# Patient Record
Sex: Male | Born: 1985 | Race: Black or African American | Hispanic: No | Marital: Single | Smoking: Current every day smoker
Health system: Southern US, Community
[De-identification: ages and names within clinical notes are randomized; demographics above are authoritative.]

---

## 2010-12-25 ENCOUNTER — Emergency Department: Payer: Self-pay | Admitting: Emergency Medicine

## 2011-08-14 ENCOUNTER — Emergency Department: Payer: Self-pay | Admitting: Emergency Medicine

## 2011-08-22 ENCOUNTER — Emergency Department: Payer: Self-pay | Admitting: *Deleted

## 2012-06-14 ENCOUNTER — Emergency Department: Payer: Self-pay | Admitting: Emergency Medicine

## 2012-06-15 LAB — BASIC METABOLIC PANEL
Anion Gap: 5 — ABNORMAL LOW (ref 7–16)
BUN: 15 mg/dL (ref 7–18)
Calcium, Total: 8.7 mg/dL (ref 8.5–10.1)
Chloride: 107 mmol/L (ref 98–107)
Creatinine: 1.23 mg/dL (ref 0.60–1.30)
EGFR (African American): >60
EGFR (Non-African Amer.): >60
Potassium: 3.4 mmol/L — ABNORMAL LOW (ref 3.5–5.1)
Sodium: 139 mmol/L (ref 136–145)

## 2012-06-15 LAB — CBC
HGB: 13.9 g/dL (ref 13.0–18.0)
Platelet: 129 10*3/uL — ABNORMAL LOW (ref 150–440)
RDW: 12.7 % (ref 11.5–14.5)
WBC: 8.4 10*3/uL (ref 3.8–10.6)

## 2012-06-15 LAB — DRUG SCREEN, URINE
Amphetamines, Ur Screen: NEGATIVE (ref ?–1000)
Barbiturates, Ur Screen: NEGATIVE (ref ?–200)
Benzodiazepine, Ur Scrn: NEGATIVE (ref ?–200)
Cannabinoid 50 Ng, Ur ~~LOC~~: POSITIVE (ref ?–50)
Methadone, Ur Screen: NEGATIVE (ref ?–300)
Opiate, Ur Screen: NEGATIVE (ref ?–300)
Tricyclic, Ur Screen: NEGATIVE (ref ?–1000)

## 2012-06-15 LAB — CK TOTAL AND CKMB (NOT AT ARMC): CK, Total: 725 U/L — ABNORMAL HIGH (ref 35–232)

## 2012-08-11 ENCOUNTER — Emergency Department: Payer: Self-pay | Admitting: Emergency Medicine

## 2012-08-12 LAB — BETA STREP CULTURE(ARMC)

## 2012-09-12 ENCOUNTER — Emergency Department: Payer: Self-pay | Admitting: Emergency Medicine

## 2014-10-01 IMAGING — CR DG CHEST 2V
1 series · 2 of 2 positions shown · non-contrast
Comparison: none

REASON FOR EXAM: CP
COMMENTS:

[Series 1: w chest pa · 0.14mm/px · 2 of 2 slices shown]
[im 1/2]
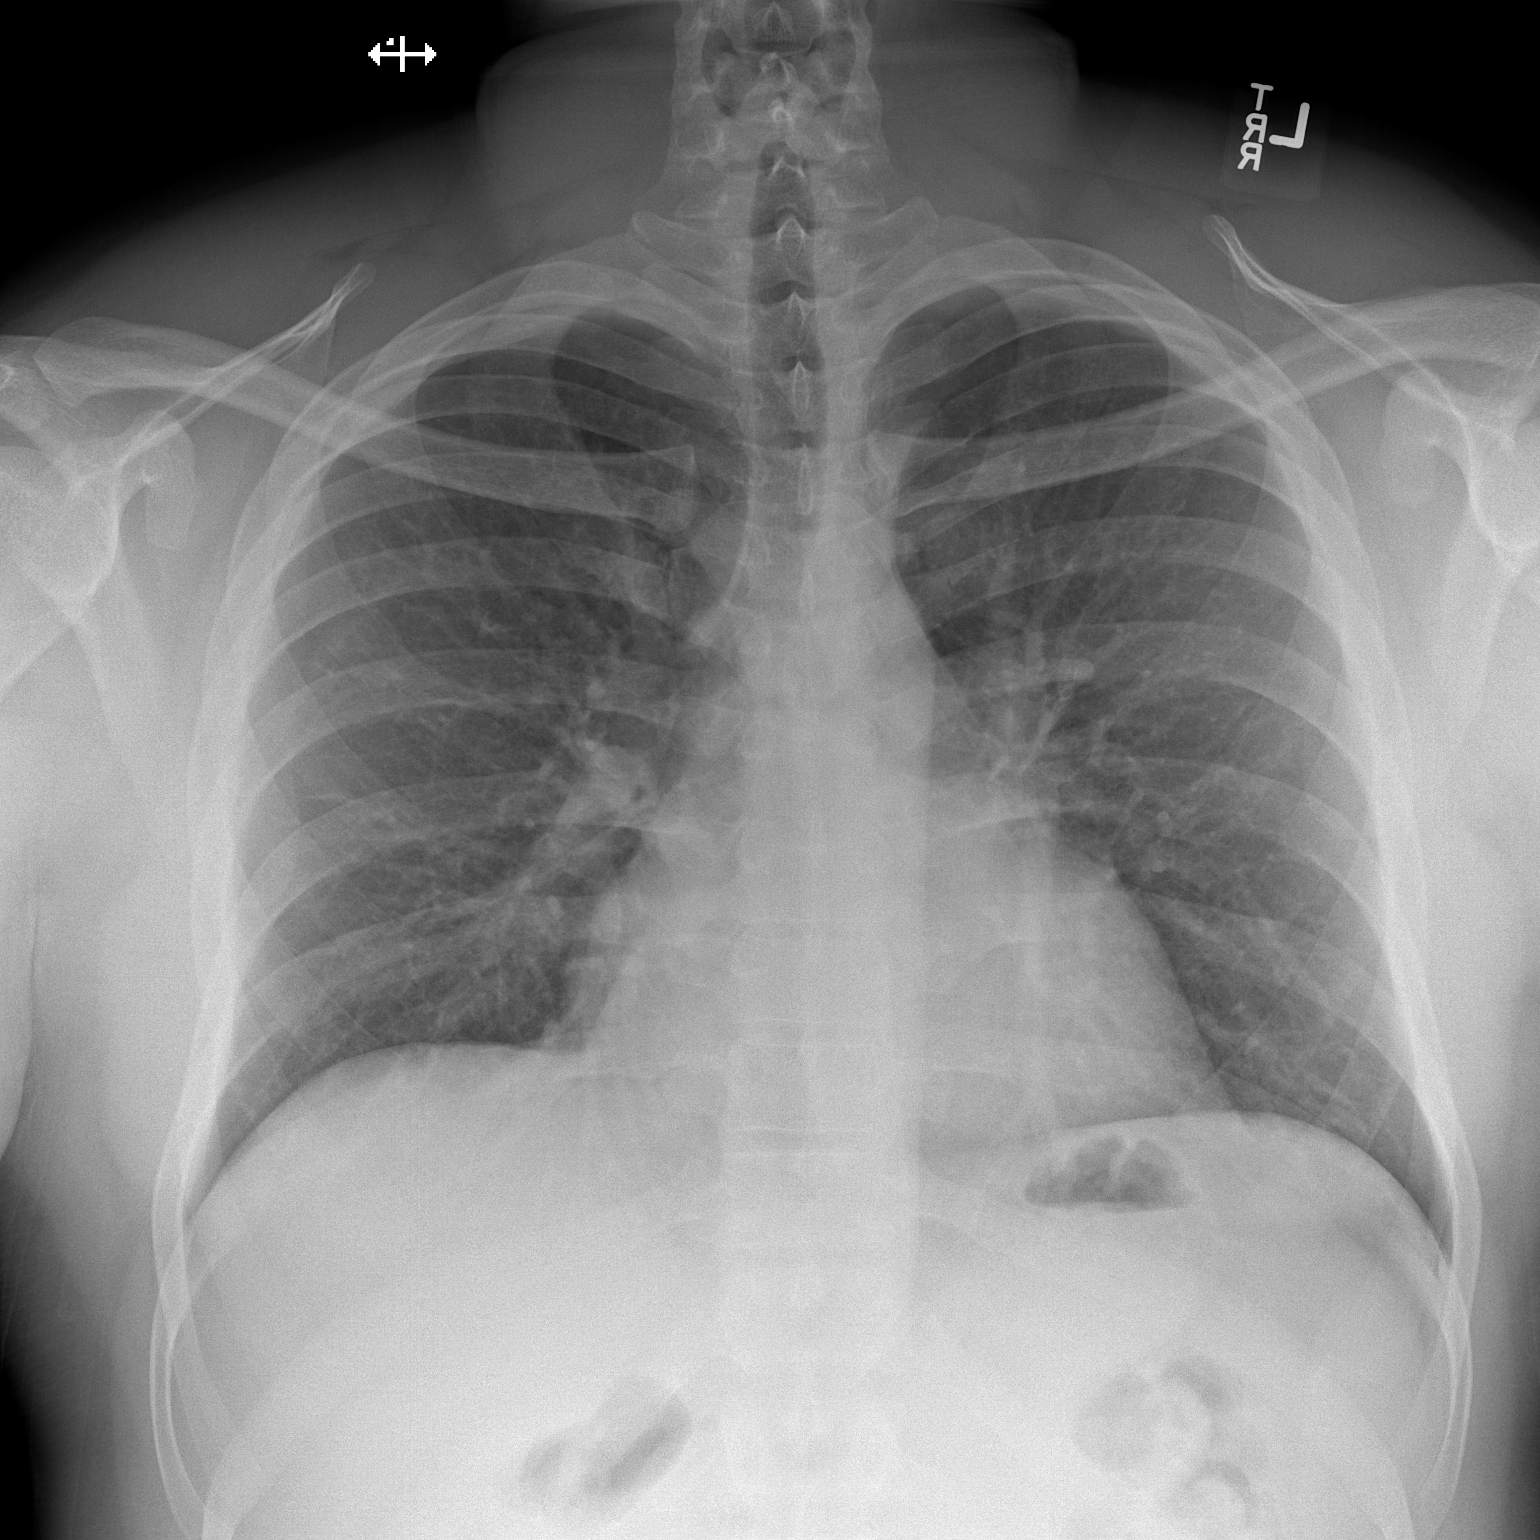
[im 2/2]
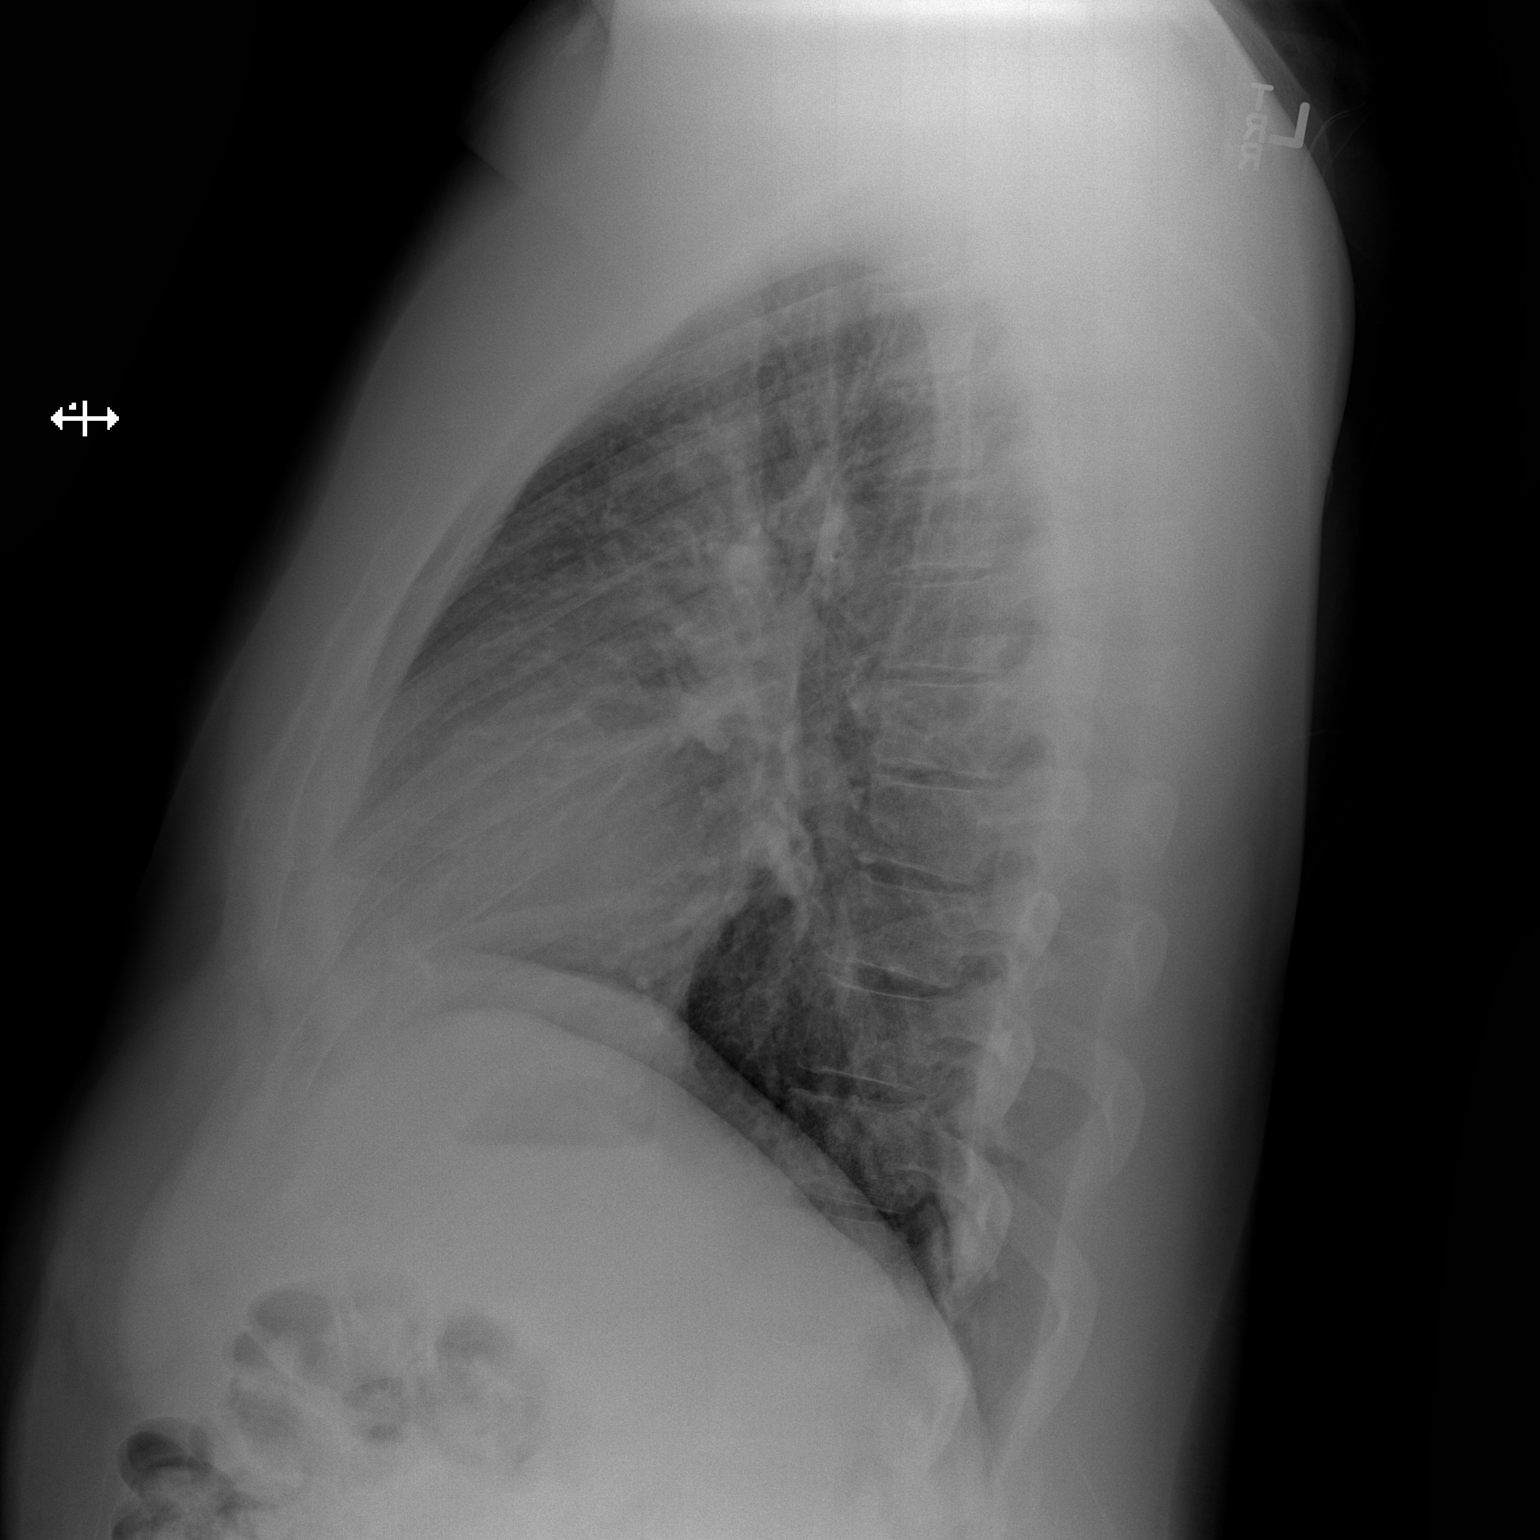

[2 of 2 positions shown; findings below may reference images not displayed]

PROCEDURE:     DXR - DXR CHEST PA (OR AP) AND LATERAL  - June 14, 2012 [DATE]

RESULT:     The lungs are adequately inflated. There is no focal infiltrate.
The cardiac silhouette is top normal in size. The pulmonary interstitial
markings are mildly increased. The mediastinum is normal in width. There is
no pleural effusion. The bony thorax exhibits no acute abnormality.
IMPRESSION: The interstitial markings of both lungs are increased which
may reflect the patient's smoking history. Otherwise fibrotic type changes
versus interstitial infiltrates could be present. Followup imaging following
any therapy is recommended.

[REDACTED]

## 2014-12-30 IMAGING — CR DG KNEE COMPLETE 4+V*L*
1 series · 4 of 4 positions shown · non-contrast
Comparison: none

REASON FOR EXAM: fall
COMMENTS:

[Series 1: ap · 0.17mm/px · 4 of 4 slices shown]
[im 1/4]
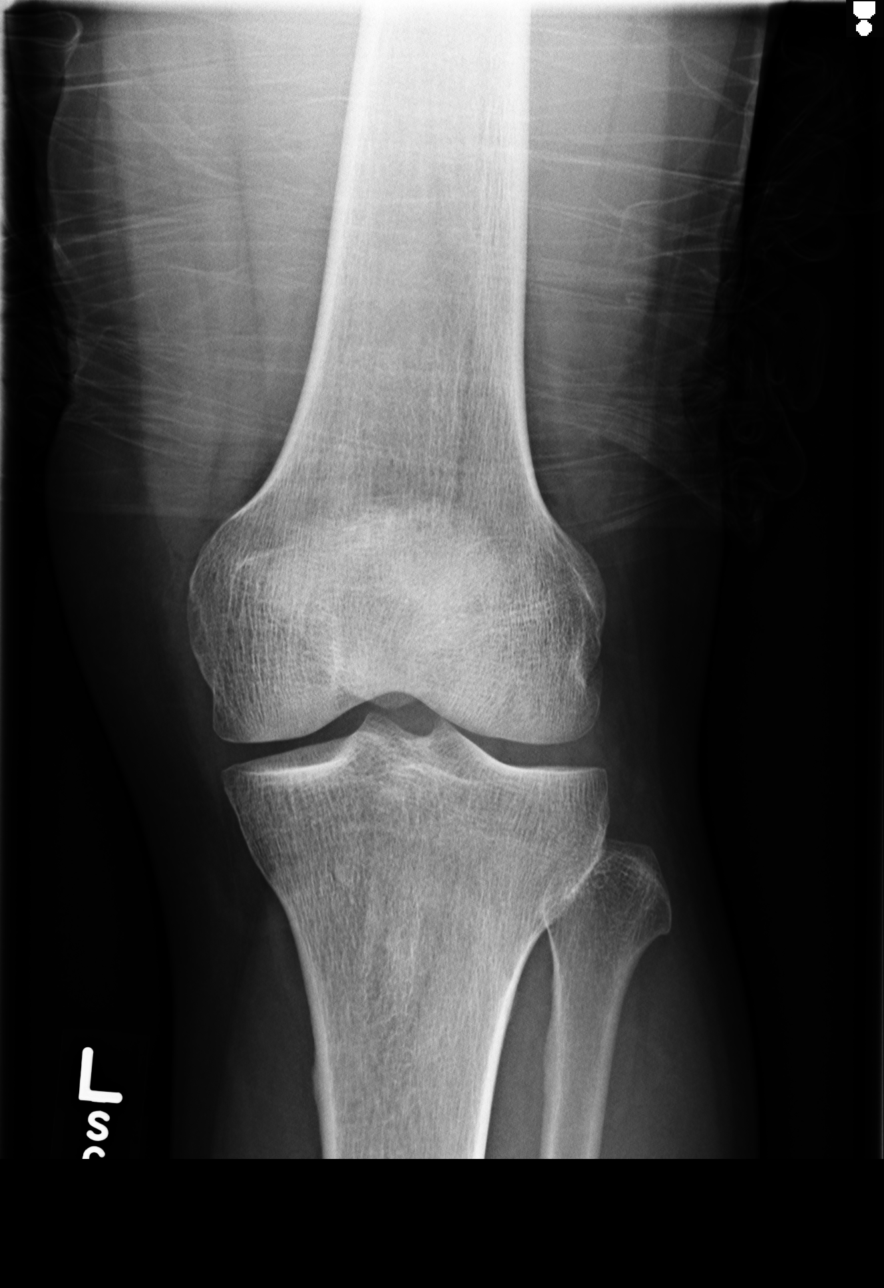
[im 2/4]
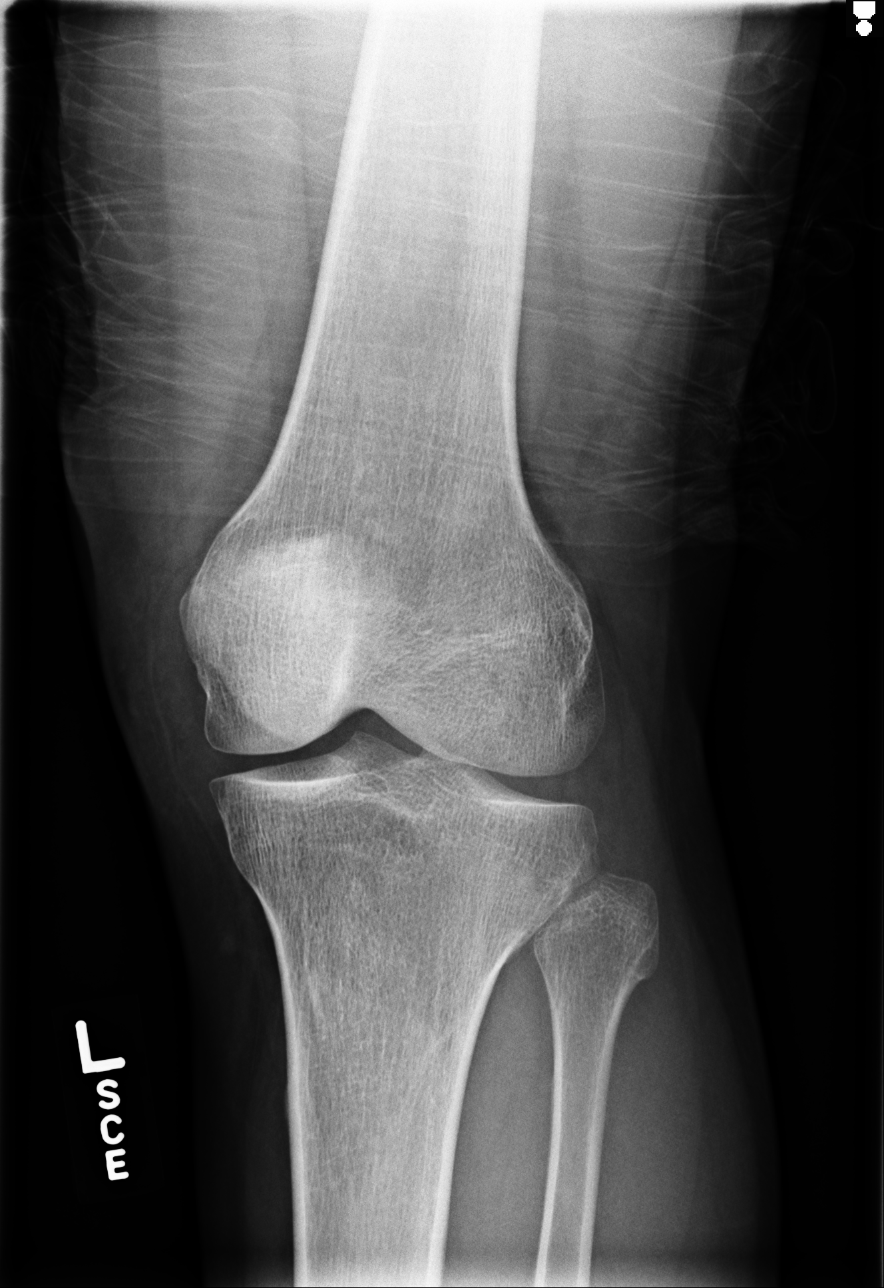
[im 3/4]
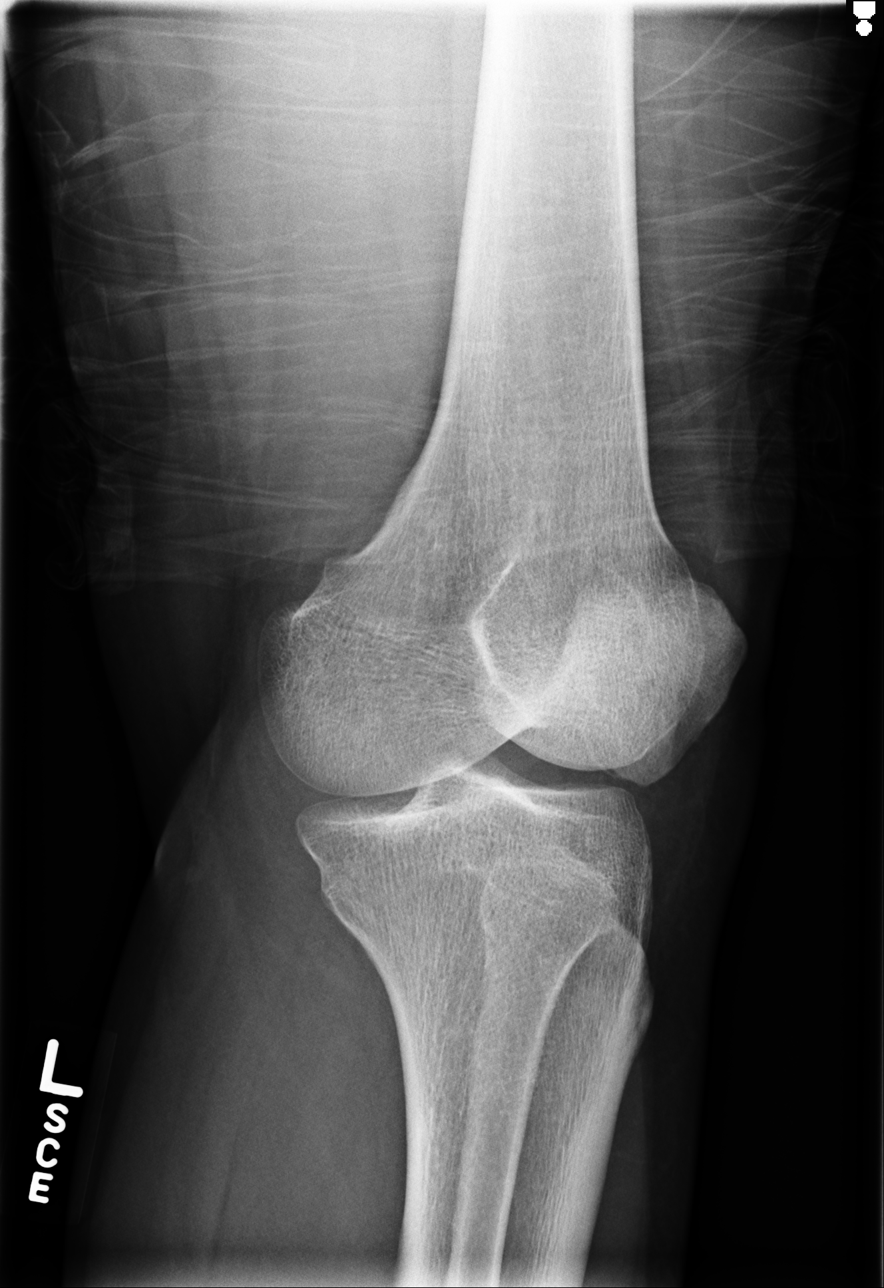
[im 4/4]
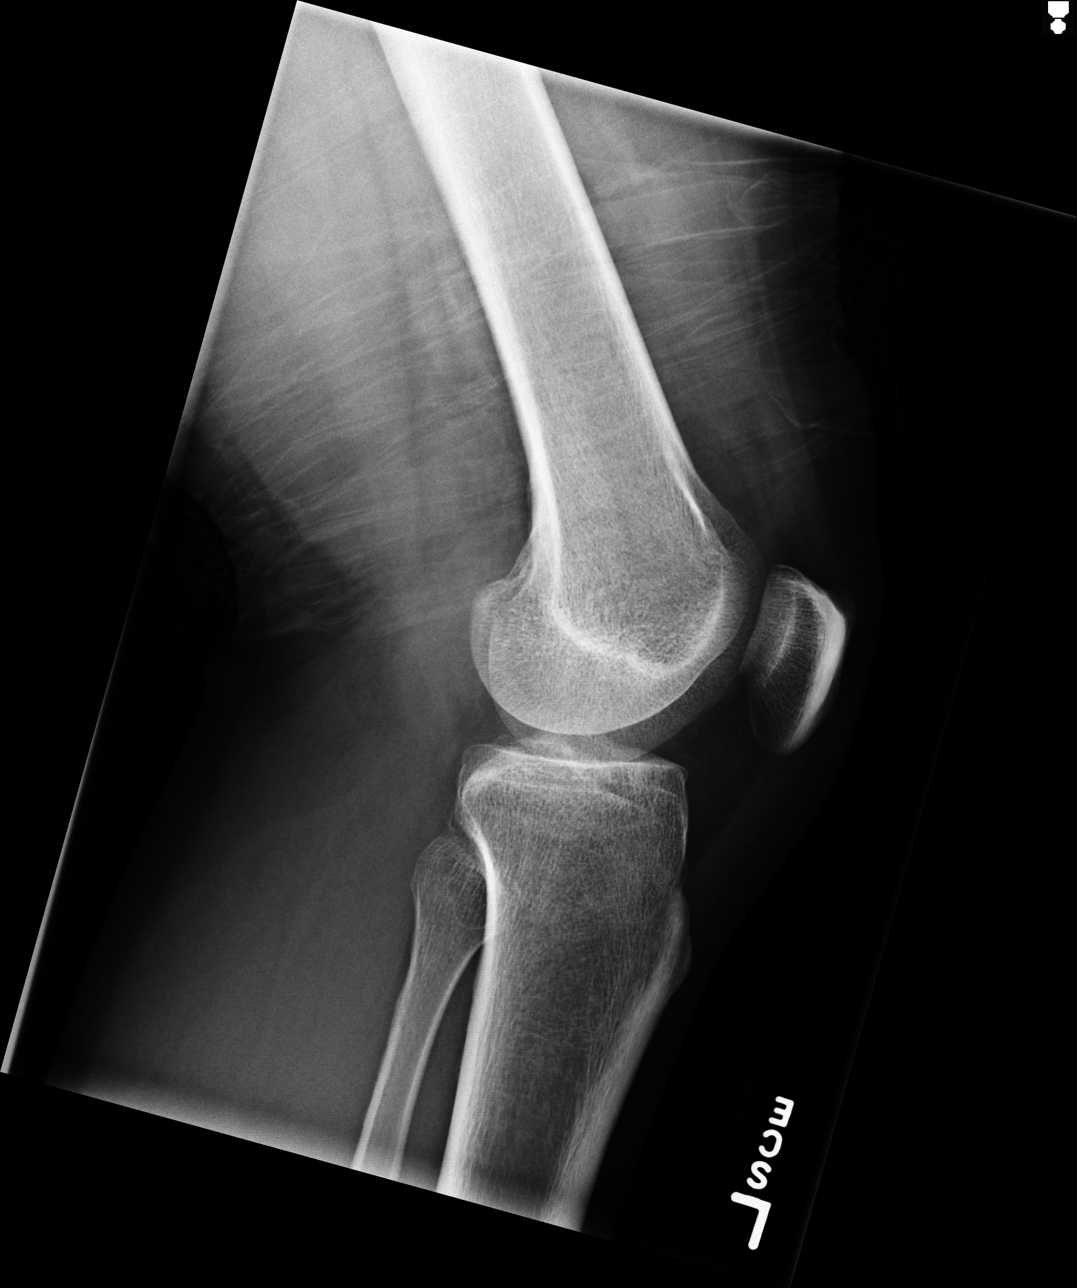

[4 of 4 positions shown; findings below may reference images not displayed]

PROCEDURE:     DXR - DXR KNEE LT COMP WITH OBLIQUES  - September 12, 2012  [DATE]

RESULT:     Four views of the left knee reveal the bones to be adequately
mineralized. There is no evidence of an acute fracture nor dislocation. The
soft tissues are prominent over the patella. No joint effusion is evident.
IMPRESSION: There is no acute bony abnormality of the left knee.

[REDACTED]

## 2015-09-11 ENCOUNTER — Emergency Department
Admission: EM | Admit: 2015-09-11 | Discharge: 2015-09-11 | Disposition: A | Discharge status: Home / Self Care | Payer: Self-pay | Attending: Emergency Medicine | Admitting: Emergency Medicine

## 2015-09-11 DIAGNOSIS — Y9389 Activity, other specified: Secondary | ICD-10-CM | POA: Insufficient documentation

## 2015-09-11 DIAGNOSIS — Y9289 Other specified places as the place of occurrence of the external cause: Secondary | ICD-10-CM | POA: Insufficient documentation

## 2015-09-11 DIAGNOSIS — J40 Bronchitis, not specified as acute or chronic: Secondary | ICD-10-CM

## 2015-09-11 DIAGNOSIS — J209 Acute bronchitis, unspecified: Secondary | ICD-10-CM | POA: Insufficient documentation

## 2015-09-11 DIAGNOSIS — X58XXXA Exposure to other specified factors, initial encounter: Secondary | ICD-10-CM | POA: Insufficient documentation

## 2015-09-11 DIAGNOSIS — F17200 Nicotine dependence, unspecified, uncomplicated: Secondary | ICD-10-CM | POA: Insufficient documentation

## 2015-09-11 DIAGNOSIS — S76211A Strain of adductor muscle, fascia and tendon of right thigh, initial encounter: Secondary | ICD-10-CM | POA: Insufficient documentation

## 2015-09-11 DIAGNOSIS — S76219A Strain of adductor muscle, fascia and tendon of unspecified thigh, initial encounter: Secondary | ICD-10-CM

## 2015-09-11 DIAGNOSIS — Y998 Other external cause status: Secondary | ICD-10-CM | POA: Insufficient documentation

## 2015-09-11 MED ORDER — AMOXICILLIN 500 MG PO TABS: 500.0000 mg | ORAL_TABLET | Freq: Three times a day (TID) | ORAL | Status: AC

## 2015-09-11 MED ORDER — AMOXICILLIN 500 MG PO CAPS
500.0000 mg | ORAL_CAPSULE | Freq: Once | ORAL | Status: AC
  Administered 2015-09-11: 500 mg via ORAL
  Filled 2015-09-11: qty 1

## 2015-09-11 MED ORDER — IBUPROFEN 800 MG PO TABS: 800.0000 mg | ORAL_TABLET | Freq: Three times a day (TID) | ORAL | Status: AC | PRN

## 2015-09-11 MED ORDER — CYCLOBENZAPRINE HCL 10 MG PO TABS: 10.0000 mg | ORAL_TABLET | Freq: Three times a day (TID) | ORAL | Status: AC | PRN

## 2015-09-11 NOTE — ED Notes (Signed)
For one week patient has had cold symptoms including chills, nonproductive cough, nasal congestion, sneezing and today started with groin pain on the right side. States he goes back to work tomorrow and works in the [employer] [place] and wants to see whats going on. Unsure of fever at home however states OTC medications have not helped symptoms. Patient is alert and oriented, no respiratory difficulty noted, nasal congestion is noted and eyes are clear of drainage.

## 2015-09-11 NOTE — Discharge Instructions (Signed)
Muscle Strain A muscle strain is an injury that occurs when a muscle is stretched beyond its normal length. Usually a small number of muscle fibers are torn when this happens. Muscle strain is rated in degrees. First-degree strains have the least amount of muscle fiber tearing and pain. Second-degree and third-degree strains have increasingly more tearing and pain.  Usually, recovery from muscle strain takes 1-2 weeks. Complete healing takes 5-6 weeks.  CAUSES  Muscle strain happens when a sudden, violent force placed on a muscle stretches it too far. This may occur with lifting, sports, or a fall.  RISK FACTORS Muscle strain is especially common in athletes.  SIGNS AND SYMPTOMS At the site of the muscle strain, there may be:  Pain.  Bruising.  Swelling.  Difficulty using the muscle due to pain or lack of normal function. DIAGNOSIS  Your health care provider will perform a physical exam and ask about your medical history. TREATMENT  Often, the best treatment for a muscle strain is resting, icing, and applying cold compresses to the injured area.  HOME CARE INSTRUCTIONS   Use the PRICE method of treatment to promote muscle healing during the first 2-3 days after your injury. The PRICE method involves:  Protecting the muscle from being injured again.  Restricting your activity and resting the injured body part.  Icing your injury. To do this, put ice in a plastic bag. Place a towel between your skin and the bag. Then, apply the ice and leave it on from 15-20 minutes each hour. After the third day, switch to moist heat packs.  Apply compression to the injured area with a splint or elastic bandage. Be careful not to wrap it too tightly. This may interfere with blood circulation or increase swelling.  Elevate the injured body part above the level of your heart as often as you can.  Only take over-the-counter or prescription medicines for pain, discomfort, or fever as directed by your  health care provider.  Warming up prior to exercise helps to prevent future muscle strains. SEEK MEDICAL CARE IF:   You have increasing pain or swelling in the injured area.  You have numbness, tingling, or a significant loss of strength in the injured area. MAKE SURE YOU:   Understand these instructions.  Will watch your condition.  Will get help right away if you are not doing well or get worse.   This information is not intended to replace advice given to you by your health care provider. Make sure you discuss any questions you have with your health care provider.   Document Released: 08/16/2005 Document Revised: 06/06/2013 Document Reviewed: 03/15/2013 Elsevier Interactive Patient Education 2016 [employer]  Upper Respiratory Infection, Adult Most upper respiratory infections (URIs) are a viral infection of the air passages leading to the lungs. A URI affects the nose, throat, and upper air passages. The most common type of URI is nasopharyngitis and is typically referred to as "the common cold." URIs run their course and usually go away on their own. Most of the time, a URI does not require medical attention, but sometimes a bacterial infection in the upper airways can follow a viral infection. This is called a secondary infection. Sinus and middle ear infections are common types of secondary upper respiratory infections. Bacterial pneumonia can also complicate a URI. A URI can worsen asthma and chronic obstructive pulmonary disease (COPD). Sometimes, these complications can require emergency medical care and may be life threatening.  CAUSES Almost all URIs are  caused by viruses. A virus is a type of germ and can spread from one person to another.  RISKS FACTORS You may be at risk for a URI if:   You smoke.   You have chronic heart or lung disease.  You have a weakened defense (immune) system.   You are very young or very old.   You have nasal allergies or  asthma.  You work in crowded or poorly ventilated areas.  You work in health care facilities or schools. SIGNS AND SYMPTOMS  Symptoms typically develop 2-3 days after you come in contact with a cold virus. Most viral URIs last 7-10 days. However, viral URIs from the influenza virus (flu virus) can last 14-18 days and are typically more severe. Symptoms may include:   Runny or stuffy (congested) nose.   Sneezing.   Cough.   Sore throat.   Headache.   Fatigue.   Fever.   Loss of appetite.   Pain in your forehead, behind your eyes, and over your cheekbones (sinus pain).  Muscle aches.  DIAGNOSIS  Your health care provider may diagnose a URI by:  Physical exam.  Tests to check that your symptoms are not due to another condition such as:  Strep throat.  Sinusitis.  Pneumonia.  Asthma. TREATMENT  A URI goes away on its own with time. It cannot be cured with medicines, but medicines may be prescribed or recommended to relieve symptoms. Medicines may help:  Reduce your fever.  Reduce your cough.  Relieve nasal congestion. HOME CARE INSTRUCTIONS   Take medicines only as directed by your health care provider.   Gargle warm saltwater or take cough drops to comfort your throat as directed by your health care provider.  Use a warm mist humidifier or inhale steam from a shower to increase air moisture. This may make it easier to breathe.  Drink enough fluid to keep your urine clear or pale yellow.   Eat soups and other clear broths and maintain good nutrition.   Rest as needed.   Return to work when your temperature has returned to normal or as your health care provider advises. You may need to stay home longer to avoid infecting others. You can also use a face mask and careful hand washing to prevent spread of the virus.  Increase the usage of your inhaler if you have asthma.   Do not use any tobacco products, including cigarettes, chewing tobacco,  or electronic cigarettes. If you need help quitting, ask your health care provider. PREVENTION  The best way to protect yourself from getting a cold is to practice good hygiene.   Avoid oral or hand contact with people with cold symptoms.   Wash your hands often if contact occurs.  There is no clear evidence that vitamin C, vitamin E, echinacea, or exercise reduces the chance of developing a cold. However, it is always recommended to get plenty of rest, exercise, and practice good nutrition.  SEEK MEDICAL CARE IF:   You are getting worse rather than better.   Your symptoms are not controlled by medicine.   You have chills.  You have worsening shortness of breath.  You have brown or red mucus.  You have yellow or brown nasal discharge.  You have pain in your face, especially when you bend forward.  You have a fever.  You have swollen neck glands.  You have pain while swallowing.  You have white areas in the back of your throat. SEEK IMMEDIATE MEDICAL  CARE IF:   You have severe or persistent:  Headache.  Ear pain.  Sinus pain.  Chest pain.  You have chronic lung disease and any of the following:  Wheezing.  Prolonged cough.  Coughing up blood.  A change in your usual mucus.  You have a stiff neck.  You have changes in your:  Vision.  Hearing.  Thinking.  Mood. MAKE SURE YOU:   Understand these instructions.  Will watch your condition.  Will get help right away if you are not doing well or get worse.   This information is not intended to replace advice given to you by your health care provider. Make sure you discuss any questions you have with your health care provider.   Document Released: 02/09/2001 Document Revised: 12/31/2014 Document Reviewed: 11/21/2013 Elsevier Interactive Patient Education 2016 [employer].   Take antibiotic as directed. Continue over-the-counter cold medicines as needed. Use ibuprofen Or muscle relaxant for  pain relief. If the right groin pain does not improve, follow-up with the orthopedist for further evaluation. Return to [facility] room for any concerns.

## 2015-09-11 NOTE — ED Provider Notes (Signed)
[facility] Emergency Department Provider Note  ____________________________________________  Time seen: Approximately 8:31 PM  I have reviewed the triage vital signs and the nursing notes.   HISTORY  Chief Complaint URI    HPI Wyatt Johnson is a 30 y.o. male with over one week of URI symptoms including cough, congestion, sore throat and headache. He feels his symptoms are worsening. He denies shortness of breath or wheezing. He also noticed right groin pain beginning this morning. He has a history of groin strain. No recent injury however. Pain is worse with movement and walking.   History reviewed. No pertinent past medical history.  There are no active problems to display for this patient.   History reviewed. No pertinent past surgical history.  Current Outpatient Rx  Name  Route  Sig  Dispense  Refill  . amoxicillin (AMOXIL) 500 MG tablet   Oral   Take 1 tablet (500 mg total) by mouth 3 (three) times daily.   30 tablet   0   . cyclobenzaprine (FLEXERIL) 10 MG tablet   Oral   Take 1 tablet (10 mg total) by mouth every 8 (eight) hours as needed for muscle spasms.   21 tablet   0   . ibuprofen (ADVIL,MOTRIN) 800 MG tablet   Oral   Take 1 tablet (800 mg total) by mouth every 8 (eight) hours as needed.   15 tablet   0     Allergies Review of patient's allergies indicates no known allergies.  No family history on file.  Social History Social History  Substance Use Topics  . Smoking status: Current Every Day Smoker  . Smokeless tobacco: None  . Alcohol Use: No    Review of Systems Constitutional: No fever/chills currently but were present earlier this week. Eyes: No visual changes. ENT: per HPI. Cardiovascular: Denies chest pain. Respiratory: Denies shortness of breath. Gastrointestinal: No abdominal pain.  No nausea, no vomiting.  No diarrhea.  No constipation. Genitourinary: Negative for dysuria. Musculoskeletal: Negative  for back pain. Skin: Negative for rash. Neurological: Negative for headaches, focal weakness or numbness. 10-point ROS otherwise negative.  ____________________________________________   PHYSICAL EXAM:  VITAL SIGNS: ED Triage Vitals  Enc Vitals Group     BP 09/11/15 1923 136/83 mmHg     Pulse Rate 09/11/15 1923 91     Resp 09/11/15 1923 18     Temp 09/11/15 1923 98.4 F (36.9 C)     Temp src --      SpO2 09/11/15 1923 96 %     Weight 09/11/15 1923 300 lb (136.079 kg)     Height 09/11/15 1923 6\' 4"  (1.93 m)     Head Cir --      Peak Flow --      Pain Score 09/11/15 1924 7     Pain Loc --      Pain Edu? --      Excl. in GC? --     Constitutional: Alert and oriented. Well appearing and in no acute distress. Eyes: Conjunctivae are normal. PERRL. EOMI. Ears:  Clear with normal landmarks. No erythema. Head: Atraumatic. Nose: No congestion/rhinnorhea. Mouth/Throat: Mucous membranes are moist.  Oropharynx non-erythematous. No lesions. Neck:  Supple.  No adenopathy.   Cardiovascular: Normal rate, regular rhythm. Grossly normal heart sounds.  Good peripheral circulation. Respiratory: Normal respiratory effort.  No retractions. Lungs CTAB. Gastrointestinal: Soft and nontender. No distention. No abdominal bruits. No CVA tenderness. Musculoskeletal: Nml ROM of upper and lower extremity joints.  Neurologic:  Normal speech and language. No gross focal neurologic deficits are appreciated. No gait instability. Skin:  Skin is warm, dry and intact. No rash noted. Psychiatric: Mood and affect are normal. Speech and behavior are normal.  ____________________________________________   LABS (all labs ordered are listed, but only abnormal results are displayed)  Labs Reviewed - No data to display ____________________________________________  EKG   ____________________________________________  RADIOLOGY   ____________________________________________   PROCEDURES  Procedure(s)  performed: None  Critical Care performed: No  ____________________________________________   INITIAL IMPRESSION / ASSESSMENT AND PLAN / ED COURSE  Pertinent labs & imaging results that were available during my care of the patient were reviewed by me and considered in my medical decision making (see chart for details).  30 year old with worsening symptoms of bronchitis over the last week. Given amoxicillin. He will continue over-the-counter cold medicines.  Do not think x-rays are necessary at this point. He also has right groin pain that is consistent with a strain. I did notice weakness of the lower abdominal muscle wall for possible hernia. Encouraged returning to the emergency room for onset of severe pain and swelling in the area. ____________________________________________   FINAL CLINICAL IMPRESSION(S) / ED DIAGNOSES  Final diagnoses:  Bronchitis  Groin strain, initial encounter      Ignacia BayleyRobert Marques Ericson, PA-C 09/11/15 2036  Minna AntisKevin Paduchowski, MD 09/11/15 2311
